# Patient Record
Sex: Male | Born: 1955 | Race: White | Marital: Married | Smoking: Former smoker
Health system: Northeastern US, Academic
[De-identification: ages and names within clinical notes are randomized; demographics above are authoritative.]

## PROBLEM LIST (undated history)

## (undated) HISTORY — PX: APPENDECTOMY: SHX54

## (undated) HISTORY — PX: ABDOMINAL SURGERY: SHX537

---

## 2015-07-28 ENCOUNTER — Encounter (HOSPITAL_BASED_OUTPATIENT_CLINIC_OR_DEPARTMENT_OTHER): Payer: Self-pay | Admitting: Emergency Medicine

## 2015-07-28 ENCOUNTER — Emergency Department (HOSPITAL_BASED_OUTPATIENT_CLINIC_OR_DEPARTMENT_OTHER)
Admission: EM | Admit: 2015-07-28 | Discharge: 2015-07-28 | Disposition: A | Discharge status: Home / Self Care | Payer: BLUE CROSS/BLUE SHIELD | Attending: Emergency Medicine | Admitting: Emergency Medicine

## 2015-07-28 DIAGNOSIS — R0982 Postnasal drip: Secondary | ICD-10-CM

## 2015-07-28 DIAGNOSIS — Z87891 Personal history of nicotine dependence: Secondary | ICD-10-CM | POA: Diagnosis not present

## 2015-07-28 DIAGNOSIS — Z7952 Long term (current) use of systemic steroids: Secondary | ICD-10-CM | POA: Insufficient documentation

## 2015-07-28 DIAGNOSIS — J3489 Other specified disorders of nose and nasal sinuses: Secondary | ICD-10-CM

## 2015-07-28 DIAGNOSIS — R05 Cough: Secondary | ICD-10-CM | POA: Diagnosis present

## 2015-07-28 NOTE — ED Notes (Signed)
Non productive cough  Pain over eyes

## 2015-07-28 NOTE — ED Provider Notes (Signed)
CSN: [id number]     Arrival date & time 07/28/15  1224 History   First MD Initiated Contact with Patient 07/28/15 1234     Chief Complaint  Patient presents with  . Cough     (Consider location/radiation/quality/duration/timing/severity/associated sxs/prior Treatment) HPI   Height  (1.803 m), weight 185 lb (83.915 kg).  Danny Wu is a 59 y.o. male complaining of 6 out of 10 bilateral temporal headache described as pressure-like alleviated with Tylenol onset 2 days ago with nonproductive cough. Patient denies rhinorrhea, nasal congestion, fever, chills, chest pain, shortness of breath, change in vision, dysarthria, ataxia, unilateral weakness, cervicalgia, abdominal pain, nausea, vomiting. He has been using Flonase which he uses daily for many years with little relief.  History reviewed. No pertinent past medical history. Past Surgical History  Procedure Laterality Date  . Appendectomy    . Abdominal surgery     No family history on file. Social History  Substance Use Topics  . Smoking status: Former Smoker    Quit date: 04/24/2015  . Smokeless tobacco: None  . Alcohol Use: No    Review of Systems  10 systems reviewed and found to be negative, except as noted in the HPI.   Allergies  Dust mite mixed allergen ext  Home Medications   Prior to Admission medications   Medication Sig Start Date End Date Taking? Authorizing Provider  fluticasone (FLONASE) 50 MCG/ACT nasal spray Place 2 sprays into both nostrils daily.   Yes Historical Provider, MD   BP 153/91 mmHg  Pulse 74  Temp(Src) 98.3 F (36.8 C) (Oral)  Resp 20  Ht  (1.803 m)  Wt 185 lb (83.915 kg)  BMI 25.81 kg/m2  SpO2 96% Physical Exam  Constitutional: He is oriented to person, place, and time. He appears well-developed and well-nourished. No distress.  HENT:  Head: Normocephalic and atraumatic.  Mouth/Throat: Oropharynx is clear and moist.  No drooling or stridor. Posterior pharynx  injected no significant tonsillar hypertrophy. No exudate. Soft palate rises symmetrically. No TTP or induration under tongue.   No tenderness to palpation of frontal or bilateral maxillary sinuses.  No mucosal edema in the nares.  Bilateral tympanic membranes with normal architecture and good light reflex.    Eyes: Conjunctivae and EOM are normal. Pupils are equal, round, and reactive to light.  No TTP of maxillary or frontal sinuses  No TTP or induration of temporal arteries bilaterally  Neck: Normal range of motion. Neck supple.  FROM to C-spine. Pt can touch chin to chest without discomfort. No TTP of midline cervical spine.   Cardiovascular: Normal rate, regular rhythm and intact distal pulses.   Pulmonary/Chest: Effort normal and breath sounds normal. No respiratory distress. He has no wheezes. He has no rales. He exhibits no tenderness.  Abdominal: Soft. Bowel sounds are normal. There is no tenderness.  Musculoskeletal: Normal range of motion. He exhibits no edema or tenderness.  Neurological: He is alert and oriented to person, place, and time. No cranial nerve deficit.  II-Visual fields grossly intact. III/IV/VI-Extraocular movements intact.  Pupils reactive bilaterally. V/VII-Smile symmetric, equal eyebrow raise,  facial sensation intact VIII- Hearing grossly intact IX/X-Normal gag XI-bilateral shoulder shrug XII-midline tongue extension Motor: 5/5 bilaterally with normal tone and bulk Cerebellar: Normal finger-to-nose  and normal heel-to-shin test.   Romberg negative Ambulates with a coordinated gait   Skin: He is not diaphoretic.  Psychiatric: He has a normal mood and affect.  Nursing note and vitals reviewed.  ED Course  Procedures (including critical care time) Labs Review Labs Reviewed - No data to display  Imaging Review No results found. I have personally reviewed and evaluated these images and lab results as part of my medical decision-making.   EKG  Interpretation None      MDM   Final diagnoses:  PND (post-nasal drip)  Sinus pressure    Filed Vitals:   07/28/15 1245 07/28/15 1254  BP:  153/91  Pulse:  74  Temp:  98.3 F (36.8 C)  TempSrc:  Oral  Resp:  20  Height:  (1.803 m)   Weight: 185 lb (83.915 kg)   SpO2:  96%    Danny Wu is a pleasant 59 y.o. male presenting with moderate bilateral temporal pressure and dry cough. Physical exam is not consistent with a bacterial sinusitis. Patient's lung sounds are clear to auscultation he is saturating well on room air, I do not think this is a pneumonia. No tenderness palpation or induration over the temporal arteries. Neuro exam is nonfocal. Doubt GCA, CVA. Likely eustachian tube dysfunction with postnasal drip. I've encouraged him to continue to use Flonase, add on nasal saline rinses and try a over-the-counter allergy decongest stent. With had an extensive discussion of return precautions patient verbalizes his understanding.  Evaluation does not show pathology that would require ongoing emergent intervention or inpatient treatment. Pt is hemodynamically stable and mentating appropriately. Discussed findings and plan with patient/guardian, who agrees with care plan. All questions answered. Return precautions discussed and outpatient follow up given.      Wynetta Emery, PA-C 07/28/15 1300  Nelva Nay, MD 07/28/15 1308

## 2015-07-28 NOTE — Discharge Instructions (Signed)
Use nasal saline (you can try [employer] Simply Saline) at least 4 times a day, use saline 5-10 minutes before using the fluticasone (flonase) nasal spray  Do not use Afrin (Oxymetazoline)  Rest, wash hands frequently  and drink plenty of water.  You may try counter medication such as Mucinex or Claritin decongestant.  Please follow with your primary care doctor in the next 2 days for a check-up. They must obtain records for further management.   Do not hesitate to return to the Emergency Department for any new, worsening or concerning symptoms.

## 2015-07-29 ENCOUNTER — Emergency Department (HOSPITAL_BASED_OUTPATIENT_CLINIC_OR_DEPARTMENT_OTHER)
Admission: EM | Admit: 2015-07-29 | Discharge: 2015-07-29 | Disposition: A | Discharge status: Home / Self Care | Payer: BLUE CROSS/BLUE SHIELD | Attending: Emergency Medicine | Admitting: Emergency Medicine

## 2015-07-29 ENCOUNTER — Emergency Department (HOSPITAL_BASED_OUTPATIENT_CLINIC_OR_DEPARTMENT_OTHER): Payer: BLUE CROSS/BLUE SHIELD

## 2015-07-29 ENCOUNTER — Encounter (HOSPITAL_BASED_OUTPATIENT_CLINIC_OR_DEPARTMENT_OTHER): Payer: Self-pay | Admitting: Emergency Medicine

## 2015-07-29 DIAGNOSIS — R0981 Nasal congestion: Secondary | ICD-10-CM | POA: Diagnosis present

## 2015-07-29 DIAGNOSIS — Z7951 Long term (current) use of inhaled steroids: Secondary | ICD-10-CM | POA: Diagnosis not present

## 2015-07-29 DIAGNOSIS — Z87891 Personal history of nicotine dependence: Secondary | ICD-10-CM | POA: Insufficient documentation

## 2015-07-29 DIAGNOSIS — R059 Cough, unspecified: Secondary | ICD-10-CM

## 2015-07-29 DIAGNOSIS — R05 Cough: Secondary | ICD-10-CM | POA: Diagnosis not present

## 2015-07-29 DIAGNOSIS — J029 Acute pharyngitis, unspecified: Secondary | ICD-10-CM | POA: Insufficient documentation

## 2015-07-29 MED ORDER — IBUPROFEN 800 MG PO TABS
800.0000 mg | ORAL_TABLET | Freq: Once | ORAL | Status: AC
  Administered 2015-07-29: 800 mg via ORAL
  Filled 2015-07-29: qty 1

## 2015-07-29 MED ORDER — HYDROCOD POLST-CPM POLST ER 10-8 MG/5ML PO SUER: 5.0000 mL | Freq: Two times a day (BID) | ORAL | Status: AC | PRN

## 2015-07-29 NOTE — ED Provider Notes (Signed)
CSN: [id number]     Arrival date & time 07/29/15  1254 History   First MD Initiated Contact with Patient 07/29/15 1303     Chief Complaint  Patient presents with  . Nasal Congestion     (Consider location/radiation/quality/duration/timing/severity/associated sxs/prior Treatment) Patient is a 59 y.o. male presenting with cough.  Cough Cough characteristics:  Non-productive Severity:  Moderate Onset quality:  Gradual Duration:  4 days Timing:  Constant Progression:  Unchanged Chronicity:  New Context: upper respiratory infection   Relieved by:  Nothing Worsened by:  Nothing tried Associated symptoms: fever and sore throat (very mild)   Associated symptoms: no chest pain, no ear fullness, no ear pain, no rash and no shortness of breath     History reviewed. No pertinent past medical history. Past Surgical History  Procedure Laterality Date  . Appendectomy    . Abdominal surgery     History reviewed. No pertinent family history. Social History  Substance Use Topics  . Smoking status: Former Smoker    Quit date: 04/24/2015  . Smokeless tobacco: None  . Alcohol Use: No    Review of Systems  Constitutional: Positive for fever.  HENT: Positive for sore throat (very mild). Negative for ear pain.   Respiratory: Positive for cough. Negative for shortness of breath.   Cardiovascular: Negative for chest pain.  Skin: Negative for rash.  All other systems reviewed and are negative.     Allergies  Dust mite mixed allergen ext  Home Medications   Prior to Admission medications   Medication Sig Start Date End Date Taking? Authorizing Provider  chlorpheniramine-HYDROcodone (TUSSIONEX PENNKINETIC ER) 10-8 MG/5ML SUER Take 5 mLs by mouth every 12 (twelve) hours as needed for cough. 07/29/15   Mirian Mo, MD  fluticasone (FLONASE) 50 MCG/ACT nasal spray Place 2 sprays into both nostrils daily.    Historical Provider, MD   BP 128/76 mmHg  Pulse 79  Temp(Src) 98.4 F (36.9  C) (Oral)  Resp 18  Ht  (1.803 m)  Wt 185 lb (83.915 kg)  BMI 25.81 kg/m2  SpO2 96% Physical Exam  Constitutional: He is oriented to person, place, and time. He appears well-developed and well-nourished.  HENT:  Head: Normocephalic and atraumatic.  Mouth/Throat: Oropharynx is clear and moist and mucous membranes are normal.  Eyes: Conjunctivae and EOM are normal.  Neck: Normal range of motion. Neck supple.  Cardiovascular: Normal rate, regular rhythm and normal heart sounds.   Pulmonary/Chest: Effort normal and breath sounds normal. No respiratory distress.  Abdominal: He exhibits no distension. There is no tenderness. There is no rebound and no guarding.  Musculoskeletal: Normal range of motion.  Neurological: He is alert and oriented to person, place, and time.  Skin: Skin is warm and dry.  Vitals reviewed.   ED Course  Procedures (including critical care time) Labs Review Labs Reviewed - No data to display  Imaging Review Dg Chest 2 View  07/29/2015   CLINICAL DATA:  Dry cough, headache, sinus congestion, fever.  EXAM: CHEST  2 VIEW  COMPARISON:  None.  FINDINGS: Lungs are clear. Mild left basilar atelectasis. No pleural effusion or pneumothorax.  The heart is normal size.  Degenerative changes of the visualized thoracolumbar spine.  IMPRESSION: No evidence of acute cardiopulmonary disease.   Electronically Signed   By: Charline Bills M.D.   On: 07/29/2015 14:43   I have personally reviewed and evaluated these images and lab results as part of my medical decision-making.   EKG  Interpretation None      MDM   Final diagnoses:  Cough    59 y.o. male without pertinent PMH presents with congestion, cough as above.  Seen yesterday for same.  States he developed a fever this morning so presents for evaluation.  He states it was 100, but does not know the decimal.  On arrival vitals and physical exam as above, benign.  CXR unremarkable.  Likely URI.  DC home in stable  condition  I have reviewed all laboratory and imaging studies if ordered as above  1. Cough         Mirian Mo, MD 07/30/15 [phone]

## 2015-07-29 NOTE — Discharge Instructions (Signed)
Upper Respiratory Infection, Adult An upper respiratory infection (URI) is also sometimes known as the common cold. The upper respiratory tract includes the nose, sinuses, throat, trachea, and bronchi. Bronchi are the airways leading to the lungs. Most people improve within 1 week, but symptoms can last up to 2 weeks. A residual cough may last even longer.  CAUSES Many different viruses can infect the tissues lining the upper respiratory tract. The tissues become irritated and inflamed and often become very moist. Mucus production is also common. A cold is contagious. You can easily spread the virus to others by oral contact. This includes kissing, sharing a glass, coughing, or sneezing. Touching your mouth or nose and then touching a surface, which is then touched by another person, can also spread the virus. SYMPTOMS  Symptoms typically develop 1 to 3 days after you come in contact with a cold virus. Symptoms vary from person to person. They may include:  Runny nose.  Sneezing.  Nasal congestion.  Sinus irritation.  Sore throat.  Loss of voice (laryngitis).  Cough.  Fatigue.  Muscle aches.  Loss of appetite.  Headache.  Low-grade fever. DIAGNOSIS  You might diagnose your own cold based on familiar symptoms, since most people get a cold 2 to 3 times a year. Your caregiver can confirm this based on your exam. Most importantly, your caregiver can check that your symptoms are not due to another disease such as strep throat, sinusitis, pneumonia, asthma, or epiglottitis. Blood tests, throat tests, and X-rays are not necessary to diagnose a common cold, but they may sometimes be helpful in excluding other more serious diseases. Your caregiver will decide if any further tests are required. RISKS AND COMPLICATIONS  You may be at risk for a more severe case of the common cold if you smoke cigarettes, have chronic heart disease (such as heart failure) or lung disease (such as asthma), or if  you have a weakened immune system. The very young and very old are also at risk for more serious infections. Bacterial sinusitis, middle ear infections, and bacterial pneumonia can complicate the common cold. The common cold can worsen asthma and chronic obstructive pulmonary disease (COPD). Sometimes, these complications can require emergency medical care and may be life-threatening. PREVENTION  The best way to protect against getting a cold is to practice good hygiene. Avoid oral or hand contact with people with cold symptoms. Wash your hands often if contact occurs. There is no clear evidence that vitamin C, vitamin E, echinacea, or exercise reduces the chance of developing a cold. However, it is always recommended to get plenty of rest and practice good nutrition. TREATMENT  Treatment is directed at relieving symptoms. There is no cure. Antibiotics are not effective, because the infection is caused by a virus, not by bacteria. Treatment may include:  Increased fluid intake. Sports drinks offer valuable electrolytes, sugars, and fluids.  Breathing heated mist or steam (vaporizer or shower).  Eating chicken soup or other clear broths, and maintaining good nutrition.  Getting plenty of rest.  Using gargles or lozenges for comfort.  Controlling fevers with ibuprofen or acetaminophen as directed by your caregiver.  Increasing usage of your inhaler if you have asthma. Zinc gel and zinc lozenges, taken in the first 24 hours of the common cold, can shorten the duration and lessen the severity of symptoms. Pain medicines may help with fever, muscle aches, and throat pain. A variety of non-prescription medicines are available to treat congestion and runny nose. Your caregiver   can make recommendations and may suggest nasal or lung inhalers for other symptoms.  HOME CARE INSTRUCTIONS   Only take over-the-counter or prescription medicines for pain, discomfort, or fever as directed by your  caregiver.  Use a warm mist humidifier or inhale steam from a shower to increase air moisture. This may keep secretions moist and make it easier to breathe.  Drink enough water and fluids to keep your urine clear or pale yellow.  Rest as needed.  Return to work when your temperature has returned to normal or as your caregiver advises. You may need to stay home longer to avoid infecting others. You can also use a face mask and careful hand washing to prevent spread of the virus. SEEK MEDICAL CARE IF:   After the first few days, you feel you are getting worse rather than better.  You need your caregiver's advice about medicines to control symptoms.  You develop chills, worsening shortness of breath, or brown or red sputum. These may be signs of pneumonia.  You develop yellow or brown nasal discharge or pain in the face, especially when you bend forward. These may be signs of sinusitis.  You develop a fever, swollen neck glands, pain with swallowing, or white areas in the back of your throat. These may be signs of strep throat. SEEK IMMEDIATE MEDICAL CARE IF:   You have a fever.  You develop severe or persistent headache, ear pain, sinus pain, or chest pain.  You develop wheezing, a prolonged cough, cough up blood, or have a change in your usual mucus (if you have chronic lung disease).  You develop sore muscles or a stiff neck. Document Released: 04/16/2001 Document Revised: 01/13/2012 Document Reviewed: 01/26/2014 [employer] Patient Information 2015 [employer]. This information is not intended to replace advice given to you by your health care provider. Make sure you discuss any questions you have with your health care provider.  

## 2015-07-29 NOTE — ED Notes (Signed)
Patient states that he was here yesterday for a "sinus infection" - patient reports no change with the exception that he may have a fever now.

## 2015-07-29 NOTE — ED Notes (Signed)
MD at bedside. 

## 2017-07-10 IMAGING — DX DG CHEST 2V
2 series · 2 of 2 positions shown · non-contrast
Comparison: None.

CLINICAL DATA: Dry cough, headache, sinus congestion, fever.

EXAM:
CHEST  2 VIEW

[chest pa]
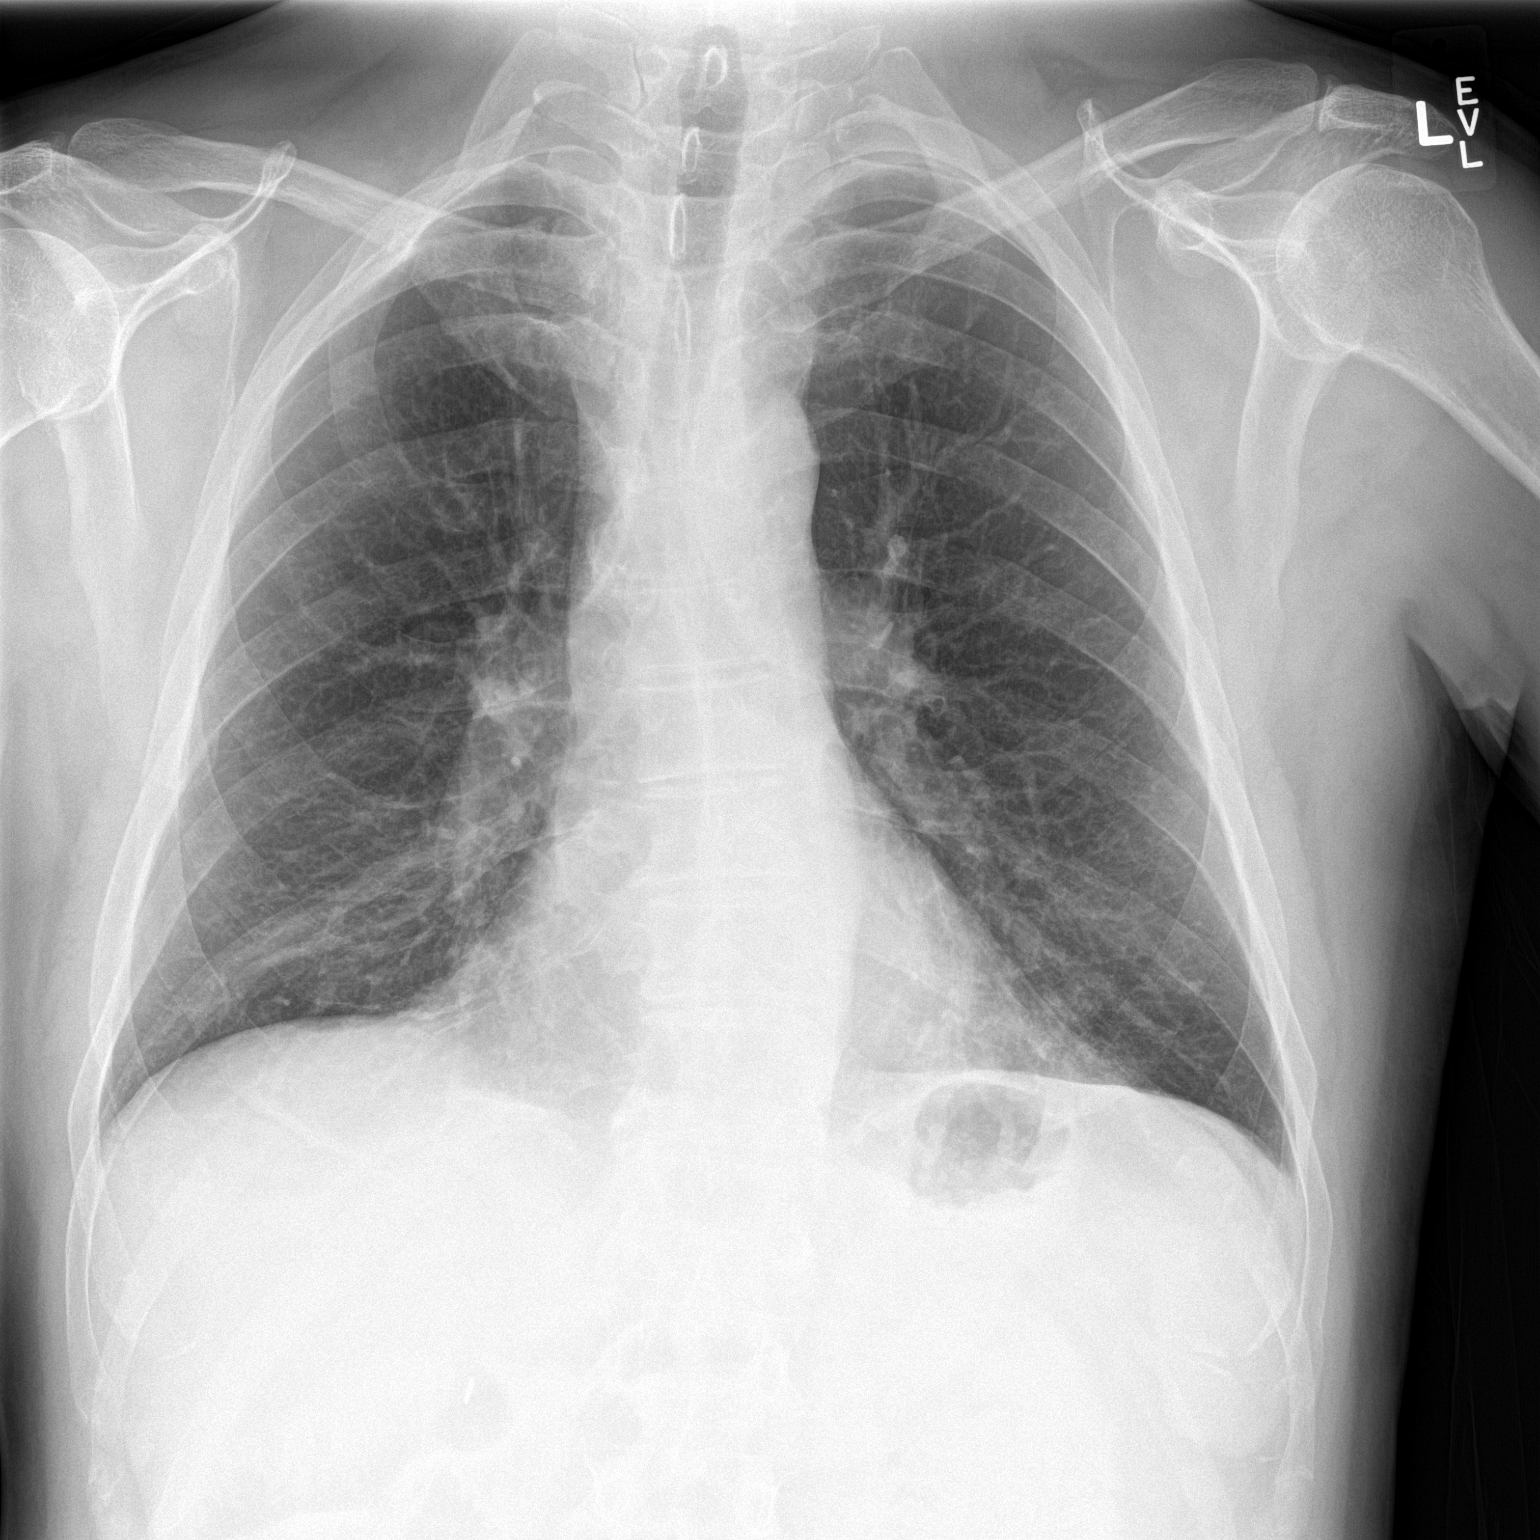

[chest lat]
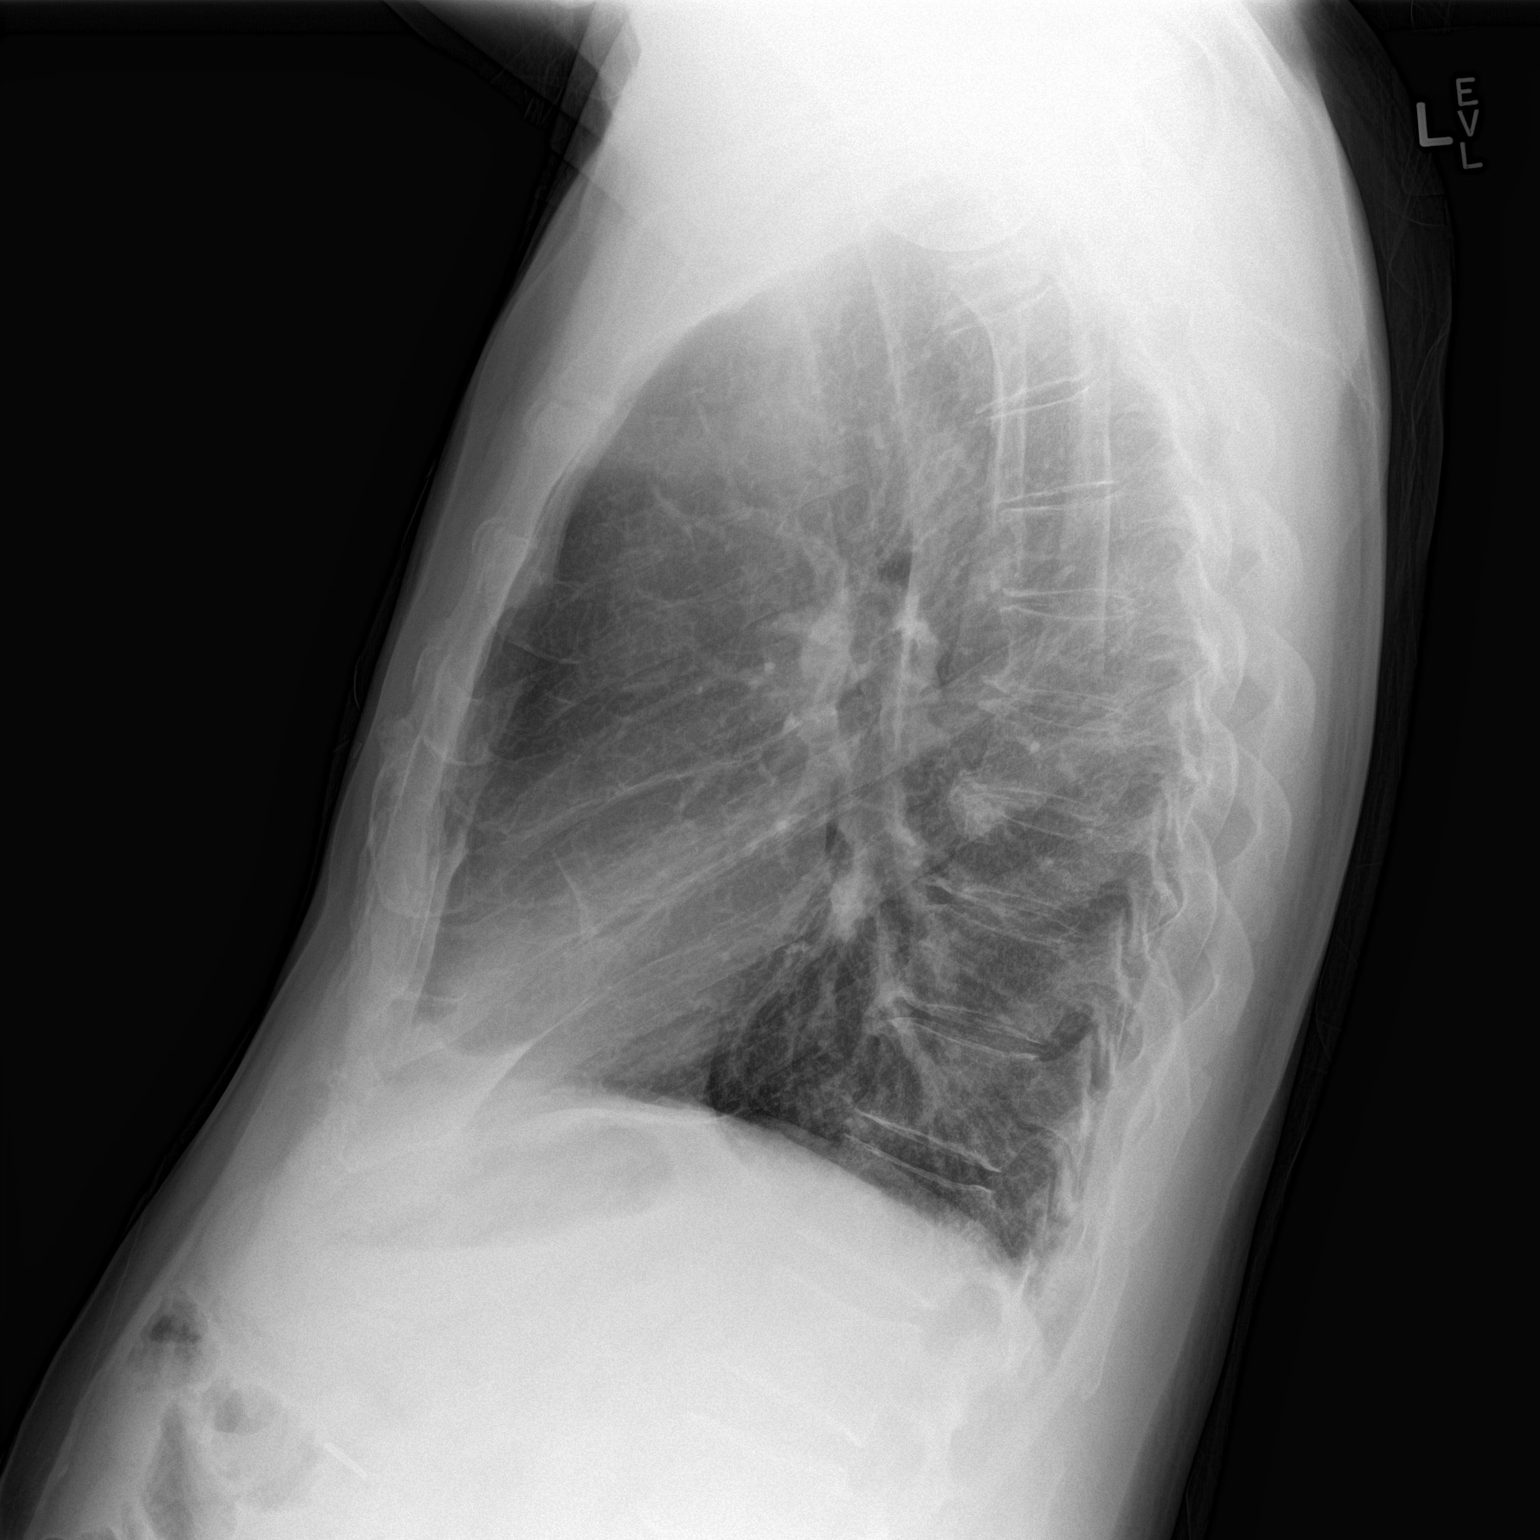

[2 of 2 positions shown; findings below may reference images not displayed]

FINDINGS: Lungs are clear. Mild left basilar atelectasis. No pleural effusion
or pneumothorax.

The heart is normal size.

Degenerative changes of the visualized thoracolumbar spine.
IMPRESSION: No evidence of acute cardiopulmonary disease.

## 2021-07-04 NOTE — Progress Notes (Signed)
[name]    Subjective:     Patient ID: Sergio Arias is a 65 y.o. male who presents to [facility] for autoimmune hepatitis. Referred by Charna Archer, MD    HPI  Sergio Arias is a 65 y.o. male with a past medical history significant for biopsy proven autoimmune hepatitis and IPMN s/p Whipple, who presents today for autoimmune hepatitis (AIH) follow up.    07/06/21  [name] is coming for follow up.  Since he was last seen, he reports some new symptoms.  He has been having some epigastric/central abdominal pain.  He feels like this has been happening for almost a year.  Far as he can recall, he had similar symptoms when he had his last MRI scan.  It comes out of the blue and can be bad enough for him to "keel over".  He had an upper GI series which showed acid reflux and a small hiatal hernia.  He was seen by GI locally and his plan for an upper endoscopy next week.  He stopped taking pantoprazole because of bloating.  He takes Pepcid twice a day.  He has not had any repeat cross-sectional imaging recently.  He has been following up with the pancreas team, Dr. Janann August in [place] and is planning to set up an appointment soon.  He has been taking azathioprine 50 mg daily without any major complaints.  Denies any constitutional symptoms, swelling, edema, confusion, hematemesis, melena or hematochezia.  Labs have been normal.    Timeline  07/28/2020  Today, he complains of abdominal pain once in a blue moon in the RUQ just below the ribs, last was 3 months ago and it happens quite infrequently, sharp pain that is constant rather than intermittent, no relation with food, would last about 10 minutes an episode. He does not feel it is musculoskeletal, he just tries not to move and it will go away. No recent weight change, at 87.6 kg. He denies any fever/chills, night sweats, dysuria, hematuria, cough, shortness of breath, runny nose, or frequent colds. Otherwise denies  confusion/encephalopathy, melena, hematochezia, nausea/vomiting, itching, jaundice, unintentional weight gain or lost, abdominal distention or swelling, leg swelling. 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    12/17/2019  He was first seen on 08/07/18 for evaluation of elevated liver enzymes and subsequent labs showed significantly worsening of ALT. He was admitted to [facility] in [place] where a liver biopsy showed AIH but no evidence of advanced fibrosis or cirrhosis. He was started on prednisone 40 mg daily to which he responded well and subsequently placed on Imuran 50mg  while prednisone was slowly tapered down. He had some initial fluctuations in his liver enzymes requiring adjustments in his meds, but currently off Prednisone. His last f/u clinic visit was in July 2020 and currently on maintenance tx with 50 mg Azathioprine, beginning on Feb 2. He denied any use of herbal products, alcohol, and reports no new medications.     He states he has seen siginificant increase in his energy level and now able to walk about 2 miles/day. He reports no significant weight gain/loss, no jaundice, pruritus, fever, chills, confusion, GI bleed, nausea or vomiting. He reports no new hospitalizations, blood transfusion, tattoos or IVDU. However, he did report 2 episodes of abd pain occurring about a week apart in December, but the pain went away by itself on both occasions without needing to go see  a provider.     His LFTs at OSH on 11/27/19 (in [place]) showed Alk Phos 102, AST 22, ALT 18 and T bili 0.5. He has mild lymphocytopenia (Abs Lymph count 733) and elevated 6 TG of 526. However, these labs were obtained before his Imuran was reduced from 75 to 50 mg on Feb 2.     His latest MRI 11/23/19 showed no stable subcentimeter simple hepatic cysts. No evidence of disease in remaining pancreas.    CURRENT MEDICATIONS:   Current Outpatient Medications   Medication Sig Dispense  Refill   . ezetimibe (ZETIA) 10 mg tablet      . famotidine (PEPCID) 40 MG tablet Take 20 mg by mouth 2 (two) times daily.     Marland Kitchen azaTHIOprine (IMURAN) 50 mg tablet Take 1 tablet (50 mg total) by mouth daily. 90 tablet 3   . fluticasone propionate (FLONASE) 50 mcg/actuation nasal spray by Each Nare route as needed for Rhinitis. (Patient not taking: Reported on 07/06/2021)     . pantoprazole 40 mg GrPS  (Patient not taking: Reported on 07/06/2021)       No current facility-administered medications for this visit.       ALLERGIES: Statins-hmg-coa reductase inhibitors, Other, and Simvastatin    Past Medical History:   Diagnosis Date   . Elevated liver enzymes    . Hyperlipidemia    . IPMN (intraductal papillary mucinous neoplasm)    . Pancreatitis    . PE (pulmonary thromboembolism)        Past Surgical History:   Procedure Laterality Date   . APPENDECTOMY     . CHOLECYSTECTOMY     . PANCREATICODUODENECTOMY  04/26/2014    with Dr. Janann August   . PANCREATICODUODENECTOMY     . SMALL INTESTINE SURGERY         Family History   Problem Relation Age of Onset   . Pancreatitis Maternal Grandfather         Dont Remember his name   . Pancreatic cancer Maternal Grandfather    . Liver disease Neg Hx    . Liver cancer Neg Hx        Social History     Socioeconomic History   . Marital status: Married     Spouse name: Not on file   . Number of children: Not on file   . Years of education: Not on file   . Highest education level: Not on file   Occupational History   . Not on file   Tobacco Use   . Smoking status: Former     Packs/day: 2.00     Years: 30.00     Pack years: 60.00     Types: Cigarettes     Quit date: 11/04/2005     Years since quitting: 15.6   . Smokeless tobacco: Never   Vaping Use   . Vaping Use: Never used   Substance and Sexual Activity   . Alcohol use: No   . Drug use: Never   . Sexual activity: Yes     Partners: Female     Birth control/protection: Surgical   Other Topics Concern   . Not on file   Social History Narrative     Denies any hx of alcohol use. Denies IVDU, cocaine use, tattoos or transfusions.      Social Determinants of Health     Financial Resource Strain: Not on file   Food Insecurity: Not on file   Transportation Needs: Not on  file   Physical Activity: Not on file   Stress: Not on file   Social Connections: Not on file   Intimate Partner Violence: Not on file   Housing Stability: Not on file       Review of systems:  A 10 point ROS was negative except as noted in HPI above.    Objective:Physical Exam     Vital signs and nursing notes reviewed.    BP 158/87   Pulse 81   Ht 1.791 m (5' 10.5")   Wt 82.6 kg (182 lb)   BMI 25.75 kg/m     General: Alert, in NAD  Head: Normocephalic, atraumatic  Eyes: no icterus, EOM grossly intact, no conjunctival injection  Resp: Good lung excursion, breathing unlabored, no audible wheezing, no accessory muscle use; clear to auscultation bilaterally  Neuro: AAOx3, no flap  GI: Abdomen soft, nontender. No rigidity/guarding.  No hepatomegaly, no splenomegaly  Skin: No jaundice, no palmar erythema, no spider angiomas  CV: RRR, no edema  Psych: Appropriate, not anxious    DATA:  I have independently reviewed available  lab/imaging data and pathology reports including from outside sources.              04/09/21              06/25/21  ALP-    76                    97  AST-    17                    18  ALT-    14                    14  T.bili-   0.4                   0.4  6TG     271   <500     Wbc-    5.1                   5.5  Hgb-    13.7                 14.7  Hct-      43.9                 45.5  Plt-       274                  216    Lab review:  Found in chart review:  06/20/2020:  WBC 4.7, absolute lymphocytes count 766 is low  Platelets 204  Hemoglobin 14.5, MCV 89.3  6-TG 327  6-MMP <500  Albumin 4.3, total protein 6.3, T bili 0.5, AST/ALT 23/21, ALP 94  CA 19-9 is 7, CEA is 1.2 (06/26/2020)      Lab Results   Component Value Date    HAVTOTAB Nonreactive 08/07/2018    HBSAB Negative  08/07/2018    HBVTOTAB Nonreactive 08/07/2018    HEVIGGIGM SEE BELOW 08/07/2018       No visits with results within 1 Day(s) from this visit.   Latest known visit with results is:   Video Visit on 06/01/2019   Component Date Value Ref Range Status   . WBC 06/28/2019 3.2 (L) 3.8 - 10.8 Thousand/uL Final   . RBC 06/28/2019 4.37  4.20 - 5.80 Million/uL Final   .  Hemoglobin 06/28/2019 13.7  13.2 - 17.1 g/dL Final   . Hematocrit 29/56/2130 43.7  38.5 - 50.0 % Final   . MCV 06/28/2019 100.0  80.0 - 100.0 fL Final   . MCH 06/28/2019 31.4  27.0 - 33.0 pg Final   . MCHC 06/28/2019 31.4 (L) 32.0 - 36.0 g/dL Final   . RDW 86/57/8469 14.4  11.0 - 15.0 % Final   . Platelet Count 06/28/2019 188  140 - 400 Thousand/uL Final   . MPV 06/28/2019 11.4  7.5 - 12.5 fL Final   . Neutrophil Absolute (ANC) 06/28/2019 2,182  1,500 - 7,800 cells/uL Final   . Lymphcytes Absoluteuto 06/28/2019 656 (L) 850 - 3,900 cells/uL Final   . Monocyte Absolute 06/28/2019 272  200 - 950 cells/uL Final   . Eosinophil Absolute 06/28/2019 61  15 - 500 cells/uL Final   . Absolute Basophils 06/28/2019 29  0 - 200 cells/uL Final   . Neutrophil % 06/28/2019 68.2  % Final   . Lymphocyte % 06/28/2019 20.5  % Final   . Monocyte % 06/28/2019 8.5  % Final   . Eosinophil % 06/28/2019 1.9  % Final   . Basophil % 06/28/2019 0.9  % Final   . Comment 06/28/2019    Final    Comment: The above test was performed; however, evaluate results  with caution. We are unable to comment on white blood  cells and cellular morphology due to degeneration.     . IgG 06/28/2019 645  600 - 1,540 mg/dL Final   . 6 TG 62/95/2841 379  235 - 400 pmol/8x10(8)RBC Final    Comment:    This test was developed and its analytical performance   characteristics have been determined by [employer]. It has not been cleared or approved by the  FDA. This assay has been validated pursuant to the CLIA   regulations and is used for clinical purposes.     Marland Kitchen 6 MMP 06/28/2019 <500  <5700  pmol/8x10(8)RBC Final    Comment: These results are useful in assessing a patient's metabolism of  azathioprine (AZA) or 6-mercaptopurine (6-MP). A 6-thioguanine  (6-TG) level greater than 235 pmol/8x10(8) RBC has been  associated with remission and very high levels have been  associated with leucopenia. 6-methylmercaptopurine (6-MMP)  levels greater than 5700 pmol/8x10(8) RBC may be associated  with hepatoxicity.     This test was developed and its analytical performance   characteristics have been determined by [facility]. It has not been cleared or approved by the  FDA. This assay has been validated pursuant to the CLIA   regulations and is used for clinical purposes.         Computed MELD-Na score unavailable. Necessary lab results were not found in the last year.  Computed MELD score unavailable. Necessary lab results were not found in the last year.    Imaging:  MRI on 07/06/2020:  Date of exam: 07/06/2020   Date reviewed at [facility]: 07/14/2020   Performed at: [facility]   EXAM: MRI ABDOMEN W/WO CONTRAST   INDICATION: History of IPMN with intermediate grade dysplasia status post   Whipple procedure in 2015 presenting for surveillance   TECHNIQUE: Multisequence, multiplanar MR imaging of the abdomen was   performed before and after the administration of intravenous contrast.   COMPARISON: MRI abdomen 11/22/2019     FINDINGS:   Lung bases: Normal.   Liver: No hepatic steatosis. No intrahepatic biliary ductal  dilation. No   suspicious masses. Scattered subcentimeter liver cysts without suspicious   features. Hepaticojejunostomy.   Gallbladder: Surgically absent   Biliary system: Not dilated.   Pancreas: Postsurgical changes of Whipple. No suspicious masses or   nodularity within the remnant pancreas or surgical bed to suggest   recurrence. No ductal dilatation of the pancreatic remnant.   Spleen: Normal.   Adrenals:Normal.   Kidneys: Normal.   Bowel:Limited evaluation due to motion  artifact. Gastrojejunostomy.   Visualized portions demonstrate no bowel wall thickening or dilatation.   Lymphadenopathy: None.   Free fluid: None.   Vasculature: Patent.   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     Postsurgical changes of Whipple. No evidence of local recurrence or   metastatic disease in the abdomen.     MRI abdomen with contrast (11/23/2019):  IMPRESSION:   Postsurgical changes of Whipple procedure. No evidence of recurrent or metastatic disease in the abdomen.      MRI abdomen with contrast (08/25/2018):  IMPRESSION:   Second opinion interpretation of outside MRI dated 08/23/2018:  1. No suspicious hepatic lesion. Scattered hepatic cysts.  2. Status post Whipple. The remnant pancreatic parenchyma is atrophic, without main duct dilatation.      Procedures:  EGD: No previous EGD  Colonoscopy: Last was 08/23/2019:  Impression:  -Perianal skin tags found on perianal exam.  -Diverticulosis in the transverse colon  -The examination was otherwise normal on direct and retroflexion views  -No specimens collected  Recommendations:  -Repeat colonoscopy in 10 years for screening purposes     Pathology:  08/2018: Liver bx  Surgical Pathology Report  Name: KORDE, HOPKIN  MRN: [id number]  Case Number: [id number]  Collection Date: 08/12/2018 12:29  Received Date: 08/12/2018 13:30    Physician(s): GHELANI,GHANSHYAM H    GHELANI,GHANSHYAM H    Specimen(s) Received  A: Non targeted liver biopsy    Clinical History  Evaluate LFT's, status-post Whipple 4 years ago.      Diagnosis  LIVER, NON-TARGETED BIOPSY:  CHRONIC HEPATITIS WITH MODERATE TO SEVEREPORTAL LYMPHOPLASMACYTIC INFLAMMATION AND MILD PORTAL FIBROSIS.  (SeeMicroscopic Description). Antionette Char    Electronically Signed By Carrolyn Leigh, M.D., Attending Pathologist  08/14/2018 14:55:30    Gross Description  The specimen is received in formalin labeled with the patient's name "Joanna Hews"  and "non-targeted liver biopsy".  It consists of two soft  tan-white tissue cores measuring 0.8 to 1.5 cm in length and averaging 0.1  cm in diameter.  Totally submitted in one cassette.  CTC/pmw      Microscopic Description  Microscopic examination reveals hepatic parenchyma with moderate to severe  portal inflammation characterized by lymphocytes, plasma cells and few  eosinophils.  Interface hepatitis is identified.  There is no lobular  activity however scattered collections of Kupffer cell aggregates with  intracytoplasmic pigment in association with few lymphocytes and plasma  cells are noted throughout the lobules.  Special stains are negative for  PAS with diastase thus showing no evidence of intracytoplasmic inclusions,  iron stain shows no evidence of increased hepatitic iron uptake and Masson  trichrome shows mild portal fibrosis.   The presence of a heavy portal  lymphoplasmacytic inflammation as well as the patient's positive SMA  antibody titers per the clinical history raises the possibility of  autoimmune hepatitis. Other entities that also remain in the differential  diagnosis include viral and drug induced hepatitis.  This report may include one or more immunohistochemical stain results that  use analyte specific reagents. All positive and negative controls have  been reviewed by the attending pathologist and are satisfactory. The tests  were developed and their performance characteristics determined by [facility] Pathology department. They have not been cleared or approved by the [place]  Food and Drug Administration. The FDA has determined that such clearance  or approval is not necessary.    Assessment and Plan:     1. Autoimmune hepatitis    2. Epigastric abdominal pain    3. H/O resection of pancreas    4. IPMN (intraductal papillary mucinous neoplasm)    5. Need for hepatitis A and B vaccination      In summary, Aimar Loflin is a 65 y.o. years old male who is presenting to the hepatology clinic  for autoimmune hepatitis that is now well controlled by azathioprine at 50 mg daily dose, with therapeutic 6-TG and undetectable 6-MMP, and normalized liver enzymes.    He is >2 years from starting treatment for his autoimmune hepatitis, which was diagnosed by liver biopsy in 08/2018.  At this point, we reviewed the benefits and risks of azathioprine, and the benefits and risks of starting to taper/withdraw azathioprine given he does not have cirrhosis or advanced fibrosis from autoimmune hepatitis and taper/withdraw can be an option.  Mr. Haar asked about reducing dose.  I feel like azathioprine 25 mg would be a very low-dose for a man his size and if we plan to continue medication for immunosuppression, reasonable to continue at the current dose especially since liver chemistries and blood panel are normal.  We could try a lower dose but that might inadequately treat autoimmune hepatitis or cause a flare.  It might not cause anything in which case we could try tapering him off.  After discussion with the patient, he preferred to continue the current dose which I have refilled.    I am concerned that he is having central/epigastric abdominal pain and has not had any cross-sectional imaging recently.  Given his history, I have ordered an MRI which he will get done in [place] and upload.  He will discuss it with Dr. Janann August when he sees him in [place].  I have also asked him to discuss whether he needs a CT chest since he reportedly was previously getting them.  On my review, he was seen by pulmonary in 2020 who ordered a CT for nonproductive cough but I am not sure if he needs chest imaging for ongoing surveillance.  He will notify me about the results of his upper endoscopy.  If needed, he can see Dr. Lawerance Bach here in our pancreas clinic.  We will check for hepatitis A and B immunity.  He will continue to get labs done every 3 months.  I will see him back in clinic annually for follow-up.    Plan:  - Labs per  standing orders; every 3 months  - MRI abdomen wwo contrast given AP and hx  - Follow up w/ Dr. Janann August, discuss symptoms, imaging and need for chest imaging  - Agree w/ EGD, will forward report  - Avoid all alcohol, herbal medications/ supplements  - Consider MRI elastography or Fibroscan next year  - He has had HAV/HBV vaccination; we will check his immune status with repeat serology for HAV antibody and HBsAb  - Recommend continuing strict EtOH abstinence  - Colonoscopy: up to date until 2030  Return in about 1 year (around 07/06/2022).    Orders Placed This Encounter   Procedures   . Hepatitis A Antibody, Total   . Hepatitis B Surface Antibody   . MRI Abdomen W/WO Contrast   . Complete Blood Count (CBC) + Auto Diff   . Hepatic function panel   . IgG, Serum   . Basic metabolic panel     A copy of this report will be sent to the referring provider(s).     Council Mechanic, MB BCh BAO  Assistant Professor of Medicine  Transplant Hepatology  [facility] [employer] [facility] [employer] [facility]      Documentation assisted by voice recognition and dictation system.

## 2021-07-06 NOTE — Patient Instructions (Signed)
-   Please follow up w/ Dr. Janann August  - Let me know what your endoscopy shows  - Labs every 3 months

## 2022-03-20 ENCOUNTER — Encounter: Payer: Self-pay | Admitting: Gastroenterology

## 2022-03-21 ENCOUNTER — Telehealth: Payer: Self-pay

## 2022-03-21 NOTE — Telephone Encounter (Signed)
Minimal records were received on this patient from Charna Archer, MD    A letter was faxed back asking them to fax:     -progress notes   - bloodwork             -imaging studies    -colonoscopy/endoscopy with pathology

## 2022-03-26 ENCOUNTER — Telehealth: Payer: Self-pay

## 2022-03-26 NOTE — Telephone Encounter (Signed)
Copied from CRM [id number]. Topic: Information Request - Other  >> Mar 26, 2022 10:51 AM Luberta Mutter wrote:  Patient called to check the status of there referral.   Provided the fax number will fax over copy of the referral

## 2022-03-26 NOTE — Telephone Encounter (Signed)
Copied from CRM [id number]. Topic: Information Request - Other  >> Mar 26, 2022 11:59 AM Luberta Mutter wrote:  Patient called he faxed over referral. Says the fax machine is not working.  Patient advised to have PCP office fax over the referral. Fax number(s) provided

## 2022-03-27 ENCOUNTER — Telehealth: Payer: Self-pay

## 2022-03-27 NOTE — Telephone Encounter (Signed)
Copied from CRM [id number]. Topic: Appointments - Appointment Information  >> Mar 27, 2022  3:33 PM Wynetta Fines wrote:  Herbert Seta, nurse from [facility] office called and said she faxed over all info to both fax#s first one on May 17.  Please call her  at [phone] to discuss what else we need.    No referral scanned in yet.    Thank you

## 2022-03-28 NOTE — Telephone Encounter (Signed)
Referral sent to review   It is on the fax and also in CE

## 2022-04-10 NOTE — Telephone Encounter (Addendum)
Patient calling to check status on appointment, can be reached at [phone]

## 2022-04-11 NOTE — Telephone Encounter (Signed)
Requested additional notes from referring office, still not in fax que.

## 2022-04-16 NOTE — Telephone Encounter (Signed)
Copied from CRM #[id number]. Topic: Appointments - Appointment Information  >> Apr 16, 2022 10:44 AM Sindy Messing F wrote:  Patient calling to check status of scheduling NPV. Writer advised per last note: awaiting further documentation from referring office. Patient can be reached at [phone].

## 2022-04-17 NOTE — Telephone Encounter (Signed)
Called referring office and left voicemail requesting notes, imaging and labs. Left a call back number for office.

## 2022-04-19 NOTE — Telephone Encounter (Signed)
Copied from CRM #[id number]. Topic: Appointments - Appointment Information  >> Apr 19, 2022 10:36 AM Sindy Messing F wrote:  Patient calling to check status of referral. He mentioned that the referring office was hit with a ransom ware attack and it may be difficult to get further information. He has tried calling and no answer.    Patient says he has a copy of his biopsy/labs. You can also reach out to [facility] in [place]. He had biopsy done here.     He also mentioned, his provider at [facility] is leaving at the end of the month, and he really needs to start care with our practice.     Patient an be reached at [phone].

## 2022-04-19 NOTE — Telephone Encounter (Signed)
Copied from CRM #[id number]. Topic: Access to Care - Speak to Provider/Office Staff  >> Apr 19, 2022 11:32 AM Bobbie Stack B wrote:  The patient is calling back to report that his additional medical records/notes can be obtained through [facility].    Please send the request for additional records via fax at: [phone] (Fax)

## 2022-04-23 NOTE — Telephone Encounter (Signed)
Called referring office to request additional info, unable to leave vm.

## 2022-04-24 NOTE — Telephone Encounter (Signed)
Next Appt  With Gastroenterology Flo Shanks, DO)  08/07/2022 at 1:00 PM

## 2022-08-07 ENCOUNTER — Encounter: Payer: Self-pay | Admitting: Gastroenterology

## 2022-08-07 ENCOUNTER — Telehealth: Payer: Self-pay

## 2022-08-07 ENCOUNTER — Other Ambulatory Visit: Payer: Self-pay

## 2022-08-07 ENCOUNTER — Ambulatory Visit: Payer: Medicare (Managed Care) | Admitting: Gastroenterology

## 2022-08-07 VITALS — BP 141/76 | Temp 97.9°F | Ht 70.0 in | Wt 187.0 lb

## 2022-08-07 DIAGNOSIS — K754 Autoimmune hepatitis: Secondary | ICD-10-CM

## 2022-08-07 DIAGNOSIS — D849 Immunodeficiency, unspecified: Secondary | ICD-10-CM

## 2022-08-07 MED ORDER — AZATHIOPRINE 50 MG PO TABS *I*
50.0000 mg | ORAL_TABLET | Freq: Every day | ORAL | 4 refills | Status: DC
Start: 2022-08-07 — End: 2022-08-08

## 2022-08-07 NOTE — Telephone Encounter (Signed)
Spoke with patient and scheduled fuv      Patient was seen for In-Office visit, on 08/07/2022, by Flo Shanks, DO, AGAF     Provider's recommendations were: Marland Kitchen Follow up in about 6 months (around 02/06/2023).Check out comments:Video visit in 6 months.    Print lab requisitions for patient if he would like to get them done locally.    Does patient have VA benefits? No, patient reported, they do not have VA benefits    AVS provided to patient via printed.

## 2022-08-07 NOTE — H&P (Addendum)
Division of Gastroenterology and Hepatology    Initial Outpatient Consult Note     Referring Physician  Charna Archer, MD  Reason for Consult    Transferring care for autoimmune hepatitis.     Council Mechanic, MD  [phone] [address]  [address] [place] [address]      I had the pleasure of seeing your patient Sergio Arias in the outpatient gastroenterology/ hepatology clinic. As you know, he is a 66 y.o. 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     HPI   Sergio Arias reports that his liver labs first were noted to be unusual in 2019 on a test done by his PCP. He underwent liver biopsy showing autoimmune hepatitis. He was initially started on prednisone and then imuran was added later. He has since tapered off of the prednisone and is only taking azathioprine 50mg  PO daily. He was previously up to 100mg  of azathioprine, but this was decreased due normalization of his LFTs. He cannot recall the last time he had abnormal LFTs.     Otherwise, his only complaint is abdominal pain. States this is an occasional sharp pain in the umbilical/ suprapubic region that happens at random, sometimes awakening him from sleep. Denies association with food. Goes away on its own after several minutes.     Denies confusion, n/v, diarrhea/ constipation, jaundice, itching, sleep disturbances.     ROS  Negative except as reported in HPI      Problem List  There is no problem list on file for this patient.      Medical History  No past medical history on file.    No past surgical history on file.    Social History     Socioeconomic History    Marital status: Married         No family history on file.    GI-Related Family History  None known     No current outpatient medications on file prior to visit.     No current facility-administered  medications on file prior to visit.       Not on File    Physical Exam  General: NAD  There were no vitals taken for this visit.  Skin: Warm and dry. No rashes.  HEENT: NCAT. No conjunctival injection sclera anicteric. Clear oropharynx.   Heart: RRR. S1 and S2.   Lungs: Normal respiratory effort. CTAB.  Abdomen: Normoactive bowel sounds. Nontender and nondistended. No guarding or rebound tenderness. No masses. No fluid wave. Scar at midline form whipple.   Musculoskeletal: Normal gait  Vascular: Warm extremities.  Lymphatic: No cervical lymphadenopathy.  Neurologic: A&O x3. No focal deficits.     Laboratory Data  No results found for: NA, K, CL, CO2, UN, CREAT  No results found for: ALK, TB, DB, ALB, ALT, AST  No results found for: WBC, HGB, HCT, MCV, PLT  No results found for: LIP, AMY   No results found for: INR    Endoscopy    07/12/21 EGD      08/23/2019 Colonoscopy       Biopsy  08/2018 liver bx        Imaging     07/27/21 MRI Abdomen  EXAM: MRI ABDOMEN W/WO CONTRAST     INDICATION: Autoimmune hepatitis and IPMN post Whipple  TECHNIQUE: Multisequence, multiplanar MR imaging of the abdomen was performed with and without administration of intravenous contrast.     COMPARISON: MRI abdomen 07/06/2020     FINDINGS:   Lung bases: Clear.     Liver: No suspicious lesion. Multiple hepatic cysts. Normal signal intensity.     Gallbladder: Cholecystectomy.     Biliary system: No ductal dilatation.     Pancreas: Partial pancreatectomy. No ductal dilatation or suspicious lesion in remnant pancreas.     Spleen: Normal size.     Adrenals: No nodule.     Kidneys: No suspicious lesion.     Lymph nodes: Redemonstration of prominent paraortic and mesenteric lymph nodes without significant change compared to prior study.     Free fluid: None.     Hepatic and mesenteric vessels: Patent.     Assessment and Recommendations  Sergio Arias is a 66 y.o. male with history of IPMN s/p whipple who presents to GI/Hepatology clinic to transfer  care for his AIH. He is currently in stable remission on AZA 50mg  PO daily, which we will plan to continue. His only complaint today occasional abdominal pain, for which he has undergone EGD without abnormality, and has had no abnormalities on colonoscopy or abdominal MRI in the past. His blood work is not concerning overall, with normal CBC and CMP. Overall, I am most suspicious for long term post-operative pain as a result of his whipple and scar tissue formation.    #AIH  - Continue AZA 50mg  PO daily (refilled)  - CBC and CMP q22months.   - Yearly dermatology examination (patient sees derm locally).     #Epigastric Pain  - Further evaluation with local GI if symptoms worsen.     #CRC Screening  - Repeat colonoscopy in 2030.     I plan to see Sergio Arias back in 6 months for follow up as video visit.     This case was discussed with Dr. Renaldo Reel    It was a pleasure seeing Sergio Arias in the [facility] Gastroenterology & Hepatology Clinic. Please do not hesitate to contact me with and further questions or concerns.     Irven Baltimore, MD   PGY-5, Gastroenterology and Hepatology Fellow  [facility] [address]  [facility], 4th Floor  [address], [address]  [place], [place] [address]    HEPATOLOGY ATTENDING    I saw and evaluated the patient. I agree with the resident's/fellow's findings and plan of care as documented.  Medical decision making was complex.    Sergio Shanks, DO, AGAF, FAASLD  Associate Professor of Medicine  Attending Physician, Division of Gastroenterology and Hepatology, [facility]  Director, [facility]

## 2022-08-08 ENCOUNTER — Telehealth: Payer: Self-pay | Admitting: Gastroenterology

## 2022-08-08 DIAGNOSIS — K754 Autoimmune hepatitis: Secondary | ICD-10-CM

## 2022-08-08 MED ORDER — AZATHIOPRINE 50 MG PO TABS *I*
50.0000 mg | ORAL_TABLET | Freq: Every day | ORAL | 1 refills | Status: DC
Start: 2022-08-08 — End: 2023-01-27

## 2022-08-08 NOTE — Telephone Encounter (Signed)
Copied from CRM [id number]. Topic: Medications/Prescriptions - Medication Question/Problem  >> Aug 08, 2022 10:29 AM Luberta Mutter wrote:  Pharmacy calling on behalf of patient states patient should have 90 day supply for the azaTHIOprine (IMURAN) 50 MG tablet    Please send to [id number] - [place], [place] - [address] number [phone]

## 2022-09-20 ENCOUNTER — Encounter: Payer: Self-pay | Admitting: Gastroenterology

## 2022-10-18 ENCOUNTER — Telehealth: Payer: Self-pay | Admitting: Gastroenterology

## 2022-10-18 ENCOUNTER — Encounter: Payer: Self-pay | Admitting: Gastroenterology

## 2022-10-18 NOTE — Telephone Encounter (Signed)
Copied from CRM #[id number]. Topic: Appointments - Appointment Information  >> Oct 18, 2022  9:36 AM Braxton Feathers wrote:    Patient calling about FUV with Dr Renaldo Reel on 02/12/23. Stated it was to be a video visit and not in person visit    Attempt to warm transfer to HEP unsuccessful ( support staff unavailable at time of call )

## 2022-10-18 NOTE — Telephone Encounter (Signed)
Spoke to patient to adjust appointment to be video.

## 2023-01-01 ENCOUNTER — Telehealth: Payer: Self-pay

## 2023-01-01 NOTE — Telephone Encounter (Signed)
VM left to inform patient his 4/10 VID needs to be rescheduled. Pushed appointment to 4/17 at 2:15pm. Requested call back if he needs to reschedule.

## 2023-01-10 ENCOUNTER — Encounter: Payer: Self-pay | Admitting: Gastroenterology

## 2023-01-27 ENCOUNTER — Other Ambulatory Visit: Payer: Self-pay | Admitting: Gastroenterology

## 2023-01-27 DIAGNOSIS — K754 Autoimmune hepatitis: Secondary | ICD-10-CM

## 2023-01-28 MED ORDER — AZATHIOPRINE 50 MG PO TABS *I*
50.0000 mg | ORAL_TABLET | Freq: Every day | ORAL | 1 refills | Status: DC
Start: 2023-01-28 — End: 2023-06-17

## 2023-02-12 ENCOUNTER — Ambulatory Visit: Payer: Medicare (Managed Care) | Admitting: Gastroenterology

## 2023-02-19 ENCOUNTER — Ambulatory Visit: Payer: Medicare (Managed Care) | Admitting: Gastroenterology

## 2023-02-19 ENCOUNTER — Encounter: Payer: Self-pay | Admitting: Gastroenterology

## 2023-02-19 ENCOUNTER — Telehealth: Payer: Self-pay

## 2023-02-19 DIAGNOSIS — D849 Immunodeficiency, unspecified: Secondary | ICD-10-CM

## 2023-02-19 DIAGNOSIS — K754 Autoimmune hepatitis: Secondary | ICD-10-CM

## 2023-02-19 NOTE — Telephone Encounter (Signed)
Upon review of the check- out report, there were no scheduling instructions listed to process at the time of check- out. Please review provider notes or review with provider to schedule accordingly if applicable . AVS was mailed to patient.

## 2023-02-19 NOTE — Patient Instructions (Signed)
Please continue azathioprine 50 mg once daily    Labs due every 3 months. We will send a new requisition to you    Continue following with dermatology

## 2023-02-19 NOTE — Progress Notes (Signed)
Sergio Arias  [id number]     02/19/2023  Charna Archer, MD  [address]  [place] [address]    VIDEO VISIT    Chief Concern/Reason for Office Visit: AIH    I had the pleasure of seeing your patient, Sergio Arias, in the outpatient gastroenterology/hepatology clinic. As you know, he is a 67 y.o. male who presents for follow-up of AIH.    HPI   Sergio Arias reports that his liver labs first were noted to be unusual in 2019 on a test done by his PCP. He underwent liver biopsy showing autoimmune hepatitis. He was initially started on prednisone and then imuran was added later. He has since tapered off of the prednisone and is only taking azathioprine 50mg  PO daily. He was previously up to 100mg  of azathioprine, but this was decreased due normalization of his LFTs. He cannot recall the last time he had abnormal LFTs.     Pt advised to continue Imuran 50 mg once daily and labs every 3 months.     The patient's last labs were from 01/10/2023 revealed ALT 22, AST 28, alk phos 99. The patient continues to report occasional sharp pains in his abdomen unrelated to PO intake, bowel movements. Lately, these pains have been improving. He does have history of Whipple procedure about 8 years ago for pancreatic cancer which he thinks may be contributing to his pain.     No fevers, chills, unintentional weight loss, jaundice, scleral icterus, changes in appetite, diarrhea, constipation, hematochezia, black stools, edema, ascites, confusion/AMS, easy bruising/bleeding.     He follows w/ dermatology locally every 6 months.     Allergies/Sensitivities:No Known Allergies (drug, envir, food or latex)    Medications:   Prior to Admission medications    Medication Sig Start Date End Date Taking? Authorizing Provider   azaTHIOprine (IMURAN) 50 MG tablet Take 1 tablet (50 mg total) by mouth daily 01/28/23   Dacquisto, Mitzi Hansen, NP     ROS:  Per HPI    Patient was made aware that due to the nature of the televideo visit we would be  unable to do a physical exam, therefore possibly delaying diagnosis. Patient was informed that physical exam would be deferred to next in office visit and additions to the plan could be made at that time based on findings. The patient agreed and consented to the plan.     GI procedures:    Endoscopy     07/12/21 EGD       08/23/2019 Colonoscopy        Biopsy  08/2018 liver bx        Labs/Imaging:     MR Abdomen: 08/30/2022  IMPRESSION:   1. Status post Whipple without evidence for recurrence in the abdomen. No metastatic disease in the abdomen.   2.  Hepatic cysts.   3.  Malrotation of the bowel.     Component 01/10/23 10/17/22   Albumin 4.5 4.5   Bilirubin, Total 0.5 0.4   Calcium 9.4 9.2   Chloride 106 103   Creatinine 0.86 0.87   Glucose 102 110   Alkaline Phosphatase 99 118   Potassium 4.4  3.9    Total Protein 6.9 6.8   Sodium 143 140   AST/SGO 28 26   Blood Urea Nitrogen 8 11   Osmolality, Cal 295 290   BUN/Cre Ratio 10 13   Bicarbonate 27 27   ALT/SGP 22 26   Anion Gap 10  10   GFR Male 2021 CKD-EPI 74 73   GFR Male 2021 CKD-EPI >90 >90     Impression(s)/Recommendation(s):   Elishua Klempner is a 61 YOM with a history of pancreatic cancer s/p Whipple procedure at [facility], HTN, and GERD who presents for a follow up visit regarding AIH currently maintained on azathioprine 50 mg once daily. Most recent labs from 01/10/2023 show ALT 22, AST 28, alk phos 99. He follows w/ dermatology locally every 6 months. Recommendations are as follows:    AIH:   - continue azathioprine 50 mg once daily  - labs every 3 months: CBC and CMP  -- will send updated lab requisition to patient  - continue following w/ dermatology  - colonoscopy due 2030    Follow up with Dr Renaldo Reel in 1 year or sooner if clinically indicated.     Consent was obtained from the patient to complete this televideo visit.    21+ minutes was spent reviewing the EMR and management of this patient. Greater than 50% of the time was spent on the video call with the  patient.    Thank you for allowing [place] to participate in this patient's care. Please do not hesitate to contact us with any questions or concerns at [phone].    Asencion Islam , PA

## 2023-03-05 ENCOUNTER — Encounter: Payer: Self-pay | Admitting: Gastroenterology

## 2023-03-20 ENCOUNTER — Encounter: Payer: Self-pay | Admitting: Gastroenterology

## 2023-03-21 ENCOUNTER — Telehealth: Payer: Self-pay | Admitting: Gastroenterology

## 2023-03-21 ENCOUNTER — Telehealth: Payer: Self-pay

## 2023-03-21 NOTE — Telephone Encounter (Signed)
Spoke to patient  to schedule  a follow up  appointment on 11/13 with Dr. Flo Shanks. If the patient calls back, please call Hep support first. If you are unable to reach someone, or there are any questions specific to the person that called the patient please IM Djuana Littleton .

## 2023-03-21 NOTE — Telephone Encounter (Signed)
Copied from CRM #[id number]. Topic: Appointments - Schedule Appointment  >> Mar 21, 2023  3:38 PM Bobbie Stack B wrote:  The patient is calling to schedule his routine FUV with Dr. Renaldo Reel.    Patient was transferred to [facility].

## 2023-03-27 NOTE — Telephone Encounter (Signed)
These have been faxed

## 2023-03-27 NOTE — Telephone Encounter (Signed)
Copied from CRM [id number]. Topic: Access to Care - Medical Records Request  >> Mar 27, 2023  9:06 AM Braxton Feathers wrote:    Harriett Sine with the office of Dr. Antony Madura, is requesting office notes or records from the patient's visit on 02/19/23, with Dr Renaldo Reel. Reason for record is plan of care. They would like the information to be faxed to [phone] ATTN unspecified. If necessary, Harriett Sine can be reached at [phone].

## 2023-04-10 ENCOUNTER — Encounter: Payer: Self-pay | Admitting: Gastroenterology

## 2023-06-16 ENCOUNTER — Other Ambulatory Visit: Payer: Self-pay | Admitting: Gastroenterology

## 2023-06-16 DIAGNOSIS — K754 Autoimmune hepatitis: Secondary | ICD-10-CM

## 2023-07-10 ENCOUNTER — Encounter: Payer: Self-pay | Admitting: Gastroenterology

## 2023-09-17 ENCOUNTER — Encounter: Payer: Self-pay | Admitting: Gastroenterology

## 2023-09-17 ENCOUNTER — Other Ambulatory Visit: Payer: Self-pay

## 2023-09-17 ENCOUNTER — Telehealth: Payer: Self-pay

## 2023-09-17 ENCOUNTER — Ambulatory Visit: Payer: Medicare (Managed Care) | Attending: Gastroenterology | Admitting: Gastroenterology

## 2023-09-17 VITALS — BP 143/79 | HR 83 | Temp 95.5°F | Ht 70.5 in | Wt 196.8 lb

## 2023-09-17 DIAGNOSIS — D849 Immunodeficiency, unspecified: Secondary | ICD-10-CM

## 2023-09-17 DIAGNOSIS — K754 Autoimmune hepatitis: Secondary | ICD-10-CM

## 2023-09-17 NOTE — Telephone Encounter (Signed)
Spoke with Sergio Arias , patient, at check out:    Provider's recommendations were:   Follow-up disposition: Follow up in about 6 months (around 03/16/2024) for Televideo visit.   Check out comments: Please print lab req   .       Scheduled all recommended appts as directed in check out comment.    AVS provided to patient via MyChart

## 2023-09-17 NOTE — Patient Instructions (Addendum)
-  Continue azathioprine 50 mg once daily  -Blood work every 3 months  -Follow up in 6 months   -Wear sunscreen when in the sun  -Annual skin check with dermatology

## 2023-09-17 NOTE — Progress Notes (Signed)
Sergio Arias  [id number]     09/17/2023    [facility] hepatologist: Dr. Renaldo Reel    Chief Complaint/Reason for Office Visit: AIH    Dear Dr. Antony Madura,     I had the pleasure of seeing your patient, Sergio Arias, in the outpatient gastroenterology/hepatology clinic. As you know, he is a 67 y.o. male who presents for follow-up of AIH.     History:  Sergio Arias reports that his liver labs first were noted to be unusual in 2019 on a test done by his PCP. He underwent liver biopsy showing autoimmune hepatitis. He was initially started on prednisone and then imuran was added later. He has since tapered off of the prednisone and is only taking azathioprine 50mg  PO daily. He was previously up to 100mg  of azathioprine, but this was decreased due normalization of his LFTs. He cannot recall the last time he had abnormal LFTs     Interval:  Blood work 07/10/2023 shows normal LFTs. He continues azathioprine 50 mg daily     He has been feeling well and has no acute concerns today. He denies fever, chills, nausea, vomiting, unintentional weight loss, loss of appetite, jaundice, icterus, edema of the lower extremities, ascites, pruritus, easy bruising/bleeding, confusions, memory changes, abdominal pain.    He sees a Armed forces operational officer annually.     ROS:  As per HPI    Vitals:   Vitals:    09/17/23 1338   BP: 143/79   Pulse: 83   Temp: 35.3 C (95.5 F)   Weight: 89.3 kg (196 lb 12.8 oz)   Height: 179.1 cm (5' 10.5")     Body mass index is 27.84 kg/m.    Physical Exam:   General: 67 y.o. male sitting comfortably in the exam room in NAD, well developed, well nourished  HEENT: sclera anicteric.   Lungs: Normal respiratory effort, clear bilaterally to auscultation.    Cor: RRR without murmur.    Abdomen: NABS, no distention or tenderness. No hepatosplenomegaly appreciated.  Extr:  No edema.    Neuro: Grossly no focal motor deficits.   Skin: No jaundice, rash, or ulcers on visualized skin.     Labs/Imaging:         Impression(s)/Recommendation(s): Sergio Arias  is a 67 y.o. male presenting for follow up of AIH. He is maintained on azathioprine 50 mg once daily. Labs continue to show normal LFTs. He is feeling well with no acute concerns. He has no signs of synthetic liver dysfunction. Recommendations include:     AIH  -Continue Azathioprine 50 mg once daily   -Labs: CBC, CMP every 3 months and prn    Immunosuppression info:  - The patient has been recommended to continue an immunomodulator Imuran (azathioprine).  Common short term side effects of the immunomodulators include non-specific nausea/emesis, rash, febrile-myalgia syndrome, pancreatitis, elevated liver function tests, and suppressed blood cell counts.  - Long term considerations include increased susceptibility to viral infections, increased rates of skin cancer, increased cervical dysplasia, and a 4-6 times relative risk of lymphoma (which nonetheless remains a very small absolute risk at roughly 5 in 10,000).  Drug interactions can occur which could be serious or even life threatening, particularly the combination of allopurinol with these medications which could lead to aplastic anemia.  Patients are asked:  - to update and maintain their vaccination schedules with their PCPs, including annual influenza vaccination, but at this time are discouraged from receiving live vaccines.  - to use sun screen with SPF>30 and to  have annual Dermatologic skin exams to screen for cancer.  - male patients should receive the Gardasil vaccine when appropriate, and   - to check all their present and future medications for potential drug interactions with their pharmacist.  - to obtain maintenance blood tests (usually CBC/diff/plts and liver function tests) at a frequency recommended by the prescribing physician, typically every 1-2 weeks at initiation, and typically gradually transitioned to every 3-4 months once a stable dosage is achieved.    Follow up in 6 months with Dr. Renaldo Reel via televideo    Thank you for allowing [place] to  participate in this patient's care.  Please do not hesitate to contact us with any questions or concerns at [phone].      Corey Harold, [place]

## 2023-09-18 ENCOUNTER — Encounter: Payer: Self-pay | Admitting: Gastroenterology

## 2023-12-17 ENCOUNTER — Encounter: Payer: Self-pay | Admitting: Gastroenterology

## 2024-01-31 ENCOUNTER — Other Ambulatory Visit: Payer: Self-pay | Admitting: Gastroenterology

## 2024-01-31 DIAGNOSIS — K754 Autoimmune hepatitis: Secondary | ICD-10-CM

## 2024-02-02 MED ORDER — AZATHIOPRINE 50 MG PO TABS *I*
50.0000 mg | ORAL_TABLET | Freq: Every day | ORAL | 3 refills | Status: AC
Start: 2024-02-02 — End: ?

## 2024-02-17 ENCOUNTER — Telehealth: Payer: Self-pay | Admitting: Gastroenterology

## 2024-02-17 NOTE — Telephone Encounter (Signed)
 Copied from CRM #[id number]. Topic: Access to Care - Speak to Provider/Office Staff  >> Feb 17, 2024  9:28 AM Majorie Scrape S wrote:  Patient is calling to schedule October in person FUV with Dr. Ashok Laws.    Writer attempted to transfer to HEP.  Transfer completed? no Line Busy(Reason)     Patient can be reached at [phone].

## 2024-02-18 NOTE — Telephone Encounter (Unsigned)
 Copied from CRM #[id number]. Topic: Appointments - Schedule Appointment  >> Feb 18, 2024  1:13 PM Murry Art wrote:  Sergio Arias, Patient, called to schedule FUV.     Writer tried to explain to the patient that he is scheduled for his 6 month FUV in May. From that visit they will decided when he is due back. Patient stated he had a hard time getting this May 2025 scheduled. Trying to get ahead of the game.     Writer tried to reach back line for advisement no answer.     Patient has been scheduled.    Were the demographics and insurance verified?: Yes  Was the patient connected to RIM if applicable?: N/A    Patient can be reached if necessary at [phone].

## 2024-03-15 ENCOUNTER — Encounter: Payer: Self-pay | Admitting: Gastroenterology

## 2024-03-31 ENCOUNTER — Ambulatory Visit: Payer: Medicare (Managed Care) | Attending: Gastroenterology | Admitting: Gastroenterology

## 2024-03-31 ENCOUNTER — Encounter: Payer: Self-pay | Admitting: Gastroenterology

## 2024-03-31 DIAGNOSIS — K754 Autoimmune hepatitis: Secondary | ICD-10-CM

## 2024-03-31 DIAGNOSIS — D849 Immunodeficiency, unspecified: Secondary | ICD-10-CM

## 2024-03-31 NOTE — Progress Notes (Signed)
 Sergio Arias  [id number]       03/31/2024  Sergio Santiago, MD  [address]  [place] [address]    TELEVIDEO APPOINTMENT    CHIEF COMPLAINT: AIH    Sergio Arias is a 68 y.o. male who has a history of AIH maintained on imuran  50mg  daily. Last seen in clinic on 09/17/2023.     Patient completed a liver biopsy after having abnormal blood work completed in 2019. Liver biopsy noted AIH and he was initially started on prednisone and then imuran  was added later. He was tapered off of the prednisone and was only taking azathioprine  50mg  PO daily. He was previously up to 100mg  of azathioprine , but this was decreased due normalization of his LFTs.     Today, he is controlled on imuran  50mg  daily, monotherapy. Blood work from 03/2024 noted normal LFTs. He reports being up to date with his dermatology checks.      He denies any signs or symptoms of decompensated liver disease including ascites, jaundice, or icterus. The patient denies nausea, vomiting. Denies abdominal pain.     Weight:   Wt Readings from Last 4 Encounters:   09/17/23 89.3 kg (196 lb 12.8 oz)   08/07/22 84.8 kg (187 lb)       Allergies/Sensitivities:Allergies[1]    Medications:   Current Outpatient Medications   Medication    azaTHIOprine  (IMURAN ) 50 MG tablet    ezetimibe (ZETIA) 10 mg tablet    amLODIPine (NORVASC) 5 mg tablet    famotidine (PEPCID) 40 mg tablet     No current facility-administered medications for this visit.       Past Medical Hx: History reviewed. No pertinent past medical history.    Past Surgical Hx: History reviewed. No pertinent surgical history.    Social Hx:   Social History     Tobacco Use    Smoking status: Former     Types: Cigarettes    Smokeless tobacco: Not on file   Substance Use Topics    Alcohol use: Not Currently       Family Hx: History reviewed. No pertinent family history.    Physical Exam:   General: Well 68 y.o. male in NAD.  HEENT: sclera anicteric.   Abdomen: No abdominal distention noted  Neuro: A and O x 3.     Skin: No jaundice.    Labs/Imaging:       Impression(s)/Recommendation(s):  Sergio Arias is a 68 y.o. male who has a history of AIH maintained on imuran  50mg  daily. Today, he is controlled on imuran  50mg  daily, monotherapy. Blood work from 03/2024 noted normal LFTs. He would like to continue having blood work checked every 3 months. He has no complaints today. Recommendations include:    AIH  - Continue Azathioprine  50 mg once daily   - Labs: CBC, CMP every 3 months and prn     Immunosuppression info:  - The patient has been recommended to continue an immunomodulator Imuran  (azathioprine ).  Common short term side effects of the immunomodulators include non-specific nausea/emesis, rash, febrile-myalgia syndrome, pancreatitis, elevated liver function tests, and suppressed blood cell counts.  - Long term considerations include increased susceptibility to viral infections, increased rates of skin cancer, increased cervical dysplasia, and a 4-6 times relative risk of lymphoma (which nonetheless remains a very small absolute risk at roughly 5 in 10,000).  Drug interactions can occur which could be serious or even life threatening, particularly the combination of allopurinol with these medications  which could lead to aplastic anemia.    Patients are asked:  - to update and maintain their vaccination schedules with their PCPs, including annual influenza vaccination, but at this time are discouraged from receiving live vaccines.  - to use sun screen with SPF>30 and to have annual Dermatologic skin exams to screen for cancer.  - male patients should receive the Gardasil vaccine when appropriate, and   - to check all their present and future medications for potential drug interactions with their pharmacist.  - to obtain maintenance blood tests (usually CBC/diff/plts and liver function tests) at a frequency recommended by the prescribing physician, typically every 1-2 weeks at initiation, and typically gradually transitioned  to every 3-4 months once a stable dosage is achieved.    Follow up in 5 months (patient would like to be seen in October to avoid snow travel)    Mode of Communication with Patient for This Visit    Mode of Communication with Patient for This Visit: Video  Patient Location: Home  Provider Location: Clinic        I personally spent 28 minutes on the calender day of the encounter, including pre and post visit work.      Consent was obtained from the patient to complete this telemedicine visit; including the potential for financial liability.    Thank you for allowing us  to participate in this patient's care.  Please do not hesistate to contact us  with any questions or concerns at [phone].      Jolaine Nasuti, NP  [facility] Gastroenterology and Hepatology         [1]   Allergies  Allergen Reactions    Statins Other (See Comments) and Rash     Caused pancreatitis

## 2024-04-02 ENCOUNTER — Telehealth: Payer: Self-pay

## 2024-04-02 NOTE — Telephone Encounter (Signed)
 Copied from CRM [id number]. Topic: Appointments - Appointment Information  >> Apr 02, 2024  8:54 AM Leory Rands wrote:  Patient, Sergio Arias , called to schedule FUV.    Patient was last seen for THM/FUV with Dr. Ashok Laws and provider recommended -  Follow up in about 5 months (around 08/31/2024) for In person clinic visit.    Writer reached out to Hep back office to check on scheduling and scheduled the below appointment (with Lauren on the line):    Patient has been scheduled.    Date: 08/04/24 at [facility]  Time: 1:30PM  Provider: Dr. Ashok Laws  Reason for appointment: FUV    Please note - patient also requested for standing lab orders placed on 5/28 to be mailed to them at address on file.    Patient can be reached if necessary at [phone].

## 2024-04-02 NOTE — Telephone Encounter (Signed)
 Patient called to schedule 5 month FUV with Dr. Ashok Laws for Oct 2025. Patient scheduled for 08/04/24

## 2024-04-14 ENCOUNTER — Telehealth: Payer: Self-pay | Admitting: Gastroenterology

## 2024-04-14 NOTE — Telephone Encounter (Signed)
 Copied from CRM #[id number]. Topic: Access to Care - Labs/Orders/Imaging  >> Apr 14, 2024  9:20 AM Licia Reek wrote:  Anik is calling to request an order be submitted for blood work.     The order can be submitted to MyChart or through the mail.     Cash can be reached with questions at [phone].

## 2024-06-04 ENCOUNTER — Encounter: Payer: Self-pay | Admitting: Gastroenterology

## 2024-06-07 ENCOUNTER — Encounter: Payer: Self-pay | Admitting: Gastroenterology

## 2024-08-03 ENCOUNTER — Other Ambulatory Visit: Payer: Self-pay

## 2024-08-04 ENCOUNTER — Encounter: Payer: Self-pay | Admitting: Gastroenterology

## 2024-08-04 ENCOUNTER — Ambulatory Visit: Payer: Medicare (Managed Care) | Attending: Gastroenterology | Admitting: Gastroenterology

## 2024-08-04 ENCOUNTER — Telehealth: Payer: Self-pay

## 2024-08-04 VITALS — BP 138/83 | HR 88 | Temp 97.3°F | Ht 71.0 in | Wt 194.5 lb

## 2024-08-04 DIAGNOSIS — K754 Autoimmune hepatitis: Secondary | ICD-10-CM | POA: Insufficient documentation

## 2024-08-04 DIAGNOSIS — D849 Immunodeficiency, unspecified: Secondary | ICD-10-CM | POA: Insufficient documentation

## 2024-08-04 NOTE — Telephone Encounter (Signed)
 Spoke with Sergio Arias , patient, at check out: Patient was seen by Dr. Josepha today and the Provider's recommendations were: Follow-up disposition: Follow up in about 1 year (around 08/04/2025). Check out comments: Print labsHep MD fuv 1 year . Unable to schedule all appts due to  schedules are not open that far yet.   AVS provided to patient via printed.

## 2024-08-04 NOTE — Progress Notes (Addendum)
 Patient: Sergio Arias: [id number] Date of [birth year]: Jan 11, 1957Date of visit: 10/1/2025PCP: IAN DALY, MDChief Complaint: AIH Subjective: I had the pleasure of seeing your patient, Sergio Arias, in the outpatient gastroenterology/hepatology clinic. As you know, he is a 68 y.o. male who presents to hepatology clinic for AIH. History:Patient completed a liver biopsy after having abnormal blood work completed in 2019. Liver biopsy noted AIH and he was initially started on prednisone and then imuran  was added later. He was tapered off of the prednisone and was only taking azathioprine  50mg  PO daily. He was previously up to 100mg  of azathioprine , but this was decreased due normalization of his LFTs. Interval:He continues azathioprine  50 mg daily. Labs show normal liver enzymes. He has been feeling ok. He had some pain in the epigastric region, for which he started famotidine 40 mg daily and resolved the pain. No persistent fevers, chills, nausea, emesis, jaundice, icterus, edema, pruritus, easy bleeding, confusions.Medications: Current Medications[1]Allergies: Allergies[2]Physical Exam: Vitals:  08/04/24 1319 BP: 138/83 Pulse: 88 Temp: 36.3 C (97.3 F) TempSrc: Temporal Weight: 88.2 kg (194 lb 8 oz) Height: 180.3 cm (5' 11) General: 68 y.o. male WDWN. NAD. HEENT: No scleral icterus.Cardiac: Well perfused, RRR without murmurLungs: No evidence of respiratory distress, clear to auscultation bilaterallyAbdomen: Normal bowel sounds, soft, nontender, nondistended.  Neuro: Awake and alert. Speech intact. Moves all four extremities. Skin:  No jaundice.Extremities: No edema, no peripheral muscle wastingGI Procedures: Per patient colonoscopy is up to date Laboratory/Imaging Results: Assessment and Recommendations: Sergio Arias is 68 y.o. 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 He is asymptomatic. Recommendations include:AIH-Continue azathioprine  50 mg daily-Labs: CBC, CMP every 3 months Immunosuppression info:- The patient has been recommended to continue an immunomodulator Imuran  (azathioprine ).  Common short term side effects of the immunomodulators include non-specific nausea/emesis, rash, febrile-myalgia syndrome, pancreatitis, elevated liver function tests, and suppressed blood cell counts.  - Long term considerations include increased susceptibility to viral infections, increased rates of skin cancer, increased cervical dysplasia, and a 4-6 times relative risk of lymphoma (which nonetheless remains a very small absolute risk at roughly 5 in 10,000).  Drug interactions can occur which could be serious or even life threatening, particularly the combination of allopurinol with these medications which could lead to aplastic anemia. Patients are asked:- to update and maintain their vaccination schedules with their PCPs, including annual influenza vaccination, but at this time are discouraged from receiving live vaccines.- to use sun screen with SPF>30 and to have annual Dermatologic skin exams to screen for cancer.- male patients should receive the Gardasil vaccine when appropriate, and - to check all their present and future medications for potential drug interactions with their pharmacist.- to obtain maintenance blood tests (usually CBC/diff/plts and liver function tests) at a frequency recommended by the prescribing physician, typically every 1-2 weeks at initiation, and typically gradually transitioned to every 3-4 months once a stable dosage is achieved.Follow up in 12 months  Thank you for allowing us  to participate in this patient's care. Please do not hesitate to contact us  with any questions or concerns at [phone]. Patient seen, evaluated and plan determined by Dr.  Josepha.  Comer Harts, PAGastroenterology and HepatologyHEPATOLOGY ATTENDINGI saw and evaluated the patient, and I provided the substantive portion the medical decision making. I made  the management plan. I take responsibility for the plan and the patient's management. The medical decision making was personally performed by me and any  additions to our assessment and care plan are noted below: AIH well controlled on imuran  50mg  daily. Labs every 3 months. Annual Dermatology. RTC in 12 monthsJonathan Lely, DO, AGAF, FAASLDAssociate Professor of MedicineAttending Physician, Division of Gastroenterology and Hepatology, URMCDirector, [facility]  [1] Current Outpatient Medications:   azaTHIOprine  (IMURAN ) 50 MG tablet, Take 1 tablet (50 mg total) by mouth daily., Disp: 90 tablet, Rfl: 3  ezetimibe (ZETIA) 10 mg tablet, Take 1 tablet (10 mg total) by mouth daily., Disp: , Rfl:   amLODIPine (NORVASC) 5 mg tablet, Take 1 tablet (5 mg total) by mouth daily., Disp: , Rfl:   famotidine (PEPCID) 40 mg tablet, Take 1 tablet (40 mg total) by mouth daily., Disp: , Rfl: [2] AllergiesAllergen Reactions  Statins Other (See Comments) and Rash   Caused pancreatitis

## 2024-08-04 NOTE — Patient Instructions (Signed)
Blood work every 3 months  Continue azathioprine 50mg  daily

## 2024-08-04 NOTE — Telephone Encounter (Signed)
 Patient added to wait list

## 2024-08-26 ENCOUNTER — Encounter: Payer: Self-pay | Admitting: Gastroenterology

## 2024-08-27 ENCOUNTER — Encounter: Payer: Self-pay | Admitting: Gastroenterology

## 2024-09-09 ENCOUNTER — Telehealth: Payer: Self-pay | Admitting: Gastroenterology

## 2024-09-09 NOTE — Telephone Encounter (Signed)
 Copied from CRM #[id number]. Topic: Information Request - Other>> Sep 09, 2024  9:53 AM Rollo BIRCH wrote:Type of information request: Looking to see who will take over for Dr. Josepha. Writer advised at this time we are unsure who will be taking over. If necessary, patient can be reached at [phone] .

## 2024-09-20 ENCOUNTER — Encounter: Payer: Self-pay | Admitting: Gastroenterology

## 2024-11-11 ENCOUNTER — Encounter: Payer: Self-pay | Admitting: Gastroenterology

## 2024-11-24 ENCOUNTER — Encounter: Payer: Self-pay | Admitting: Gastroenterology
